# Patient Record
Sex: Female | Born: 1970 | Race: White | Hispanic: No | Marital: Married | Smoking: Never smoker
Health system: Southern US, Community
[De-identification: ages and names within clinical notes are randomized; demographics above are authoritative.]

## PROBLEM LIST (undated history)

## (undated) DIAGNOSIS — R9082 White matter disease, unspecified: Secondary | ICD-10-CM

## (undated) DIAGNOSIS — R202 Paresthesia of skin: Secondary | ICD-10-CM

## (undated) DIAGNOSIS — G9332 Myalgic encephalomyelitis/chronic fatigue syndrome: Secondary | ICD-10-CM

## (undated) DIAGNOSIS — K259 Gastric ulcer, unspecified as acute or chronic, without hemorrhage or perforation: Secondary | ICD-10-CM

## (undated) DIAGNOSIS — G43909 Migraine, unspecified, not intractable, without status migrainosus: Secondary | ICD-10-CM

## (undated) DIAGNOSIS — R011 Cardiac murmur, unspecified: Secondary | ICD-10-CM

## (undated) DIAGNOSIS — E785 Hyperlipidemia, unspecified: Secondary | ICD-10-CM

## (undated) DIAGNOSIS — E039 Hypothyroidism, unspecified: Secondary | ICD-10-CM

## (undated) DIAGNOSIS — F419 Anxiety disorder, unspecified: Secondary | ICD-10-CM

## (undated) DIAGNOSIS — M5030 Other cervical disc degeneration, unspecified cervical region: Secondary | ICD-10-CM

## (undated) DIAGNOSIS — G8929 Other chronic pain: Secondary | ICD-10-CM

## (undated) DIAGNOSIS — R7303 Prediabetes: Secondary | ICD-10-CM

## (undated) DIAGNOSIS — F32A Depression, unspecified: Secondary | ICD-10-CM

## (undated) HISTORY — PX: BREAST CYST EXCISION: SHX579

## (undated) HISTORY — PX: CHOLECYSTECTOMY: SHX55

---

## 1998-08-31 ENCOUNTER — Other Ambulatory Visit: Admission: RE | Admit: 1998-08-31 | Discharge: 1998-08-31 | Payer: Self-pay | Admitting: Obstetrics and Gynecology

## 1998-11-03 DIAGNOSIS — G459 Transient cerebral ischemic attack, unspecified: Secondary | ICD-10-CM

## 1998-11-03 HISTORY — DX: Transient cerebral ischemic attack, unspecified: G45.9

## 1999-03-28 ENCOUNTER — Inpatient Hospital Stay (HOSPITAL_COMMUNITY): Admission: AD | Admit: 1999-03-28 | Discharge: 1999-03-30 | Payer: Self-pay | Admitting: Obstetrics and Gynecology

## 1999-05-21 ENCOUNTER — Other Ambulatory Visit: Admission: RE | Admit: 1999-05-21 | Discharge: 1999-05-21 | Payer: Self-pay | Admitting: Obstetrics and Gynecology

## 2000-09-08 ENCOUNTER — Other Ambulatory Visit: Admission: RE | Admit: 2000-09-08 | Discharge: 2000-09-08 | Payer: Self-pay | Admitting: Obstetrics and Gynecology

## 2002-02-08 ENCOUNTER — Other Ambulatory Visit: Admission: RE | Admit: 2002-02-08 | Discharge: 2002-02-08 | Payer: Self-pay | Admitting: Obstetrics and Gynecology

## 2002-03-15 ENCOUNTER — Encounter: Payer: Self-pay | Admitting: Obstetrics and Gynecology

## 2002-03-15 ENCOUNTER — Encounter: Admission: RE | Admit: 2002-03-15 | Discharge: 2002-03-15 | Payer: Self-pay | Admitting: Obstetrics and Gynecology

## 2002-11-03 HISTORY — PX: AUGMENTATION MAMMAPLASTY: SUR837

## 2003-02-27 ENCOUNTER — Other Ambulatory Visit: Admission: RE | Admit: 2003-02-27 | Discharge: 2003-02-27 | Payer: Self-pay | Admitting: Obstetrics and Gynecology

## 2004-03-07 ENCOUNTER — Other Ambulatory Visit: Admission: RE | Admit: 2004-03-07 | Discharge: 2004-03-07 | Payer: Self-pay | Admitting: Obstetrics and Gynecology

## 2005-06-19 ENCOUNTER — Other Ambulatory Visit: Admission: RE | Admit: 2005-06-19 | Discharge: 2005-06-19 | Payer: Self-pay | Admitting: Obstetrics and Gynecology

## 2012-10-06 ENCOUNTER — Other Ambulatory Visit (HOSPITAL_COMMUNITY): Payer: Self-pay | Admitting: Obstetrics and Gynecology

## 2012-10-06 DIAGNOSIS — E041 Nontoxic single thyroid nodule: Secondary | ICD-10-CM

## 2012-10-07 ENCOUNTER — Ambulatory Visit (HOSPITAL_COMMUNITY): Payer: BC Managed Care – PPO

## 2012-11-09 ENCOUNTER — Ambulatory Visit (HOSPITAL_COMMUNITY)
Admission: RE | Admit: 2012-11-09 | Discharge: 2012-11-09 | Disposition: A | Discharge status: Home / Self Care | Payer: BC Managed Care – PPO | Source: Ambulatory Visit | Attending: Obstetrics and Gynecology | Admitting: Obstetrics and Gynecology

## 2012-11-09 DIAGNOSIS — E041 Nontoxic single thyroid nodule: Secondary | ICD-10-CM

## 2012-11-09 DIAGNOSIS — Z0389 Encounter for observation for other suspected diseases and conditions ruled out: Secondary | ICD-10-CM | POA: Insufficient documentation

## 2013-10-18 ENCOUNTER — Other Ambulatory Visit: Payer: Self-pay | Admitting: Obstetrics and Gynecology

## 2013-10-18 DIAGNOSIS — R928 Other abnormal and inconclusive findings on diagnostic imaging of breast: Secondary | ICD-10-CM

## 2013-11-01 ENCOUNTER — Ambulatory Visit
Admission: RE | Admit: 2013-11-01 | Discharge: 2013-11-01 | Disposition: A | Discharge status: Home / Self Care | Payer: BC Managed Care – PPO | Source: Ambulatory Visit | Attending: Obstetrics and Gynecology | Admitting: Obstetrics and Gynecology

## 2013-11-01 DIAGNOSIS — R928 Other abnormal and inconclusive findings on diagnostic imaging of breast: Secondary | ICD-10-CM

## 2014-11-03 HISTORY — PX: CARPAL TUNNEL RELEASE: SHX101

## 2018-01-13 ENCOUNTER — Other Ambulatory Visit: Payer: Self-pay | Admitting: Obstetrics and Gynecology

## 2018-01-13 DIAGNOSIS — N6452 Nipple discharge: Secondary | ICD-10-CM

## 2018-01-18 ENCOUNTER — Ambulatory Visit
Admission: RE | Admit: 2018-01-18 | Discharge: 2018-01-18 | Disposition: A | Discharge status: Home / Self Care | Payer: BLUE CROSS/BLUE SHIELD | Source: Ambulatory Visit | Attending: Obstetrics and Gynecology | Admitting: Obstetrics and Gynecology

## 2018-01-18 ENCOUNTER — Ambulatory Visit: Payer: Self-pay

## 2018-01-18 DIAGNOSIS — N6452 Nipple discharge: Secondary | ICD-10-CM

## 2019-01-20 ENCOUNTER — Other Ambulatory Visit: Payer: Self-pay | Admitting: Obstetrics and Gynecology

## 2019-01-20 DIAGNOSIS — R928 Other abnormal and inconclusive findings on diagnostic imaging of breast: Secondary | ICD-10-CM

## 2019-01-26 ENCOUNTER — Other Ambulatory Visit: Payer: BLUE CROSS/BLUE SHIELD

## 2019-09-12 IMAGING — MG DIGITAL DIAGNOSTIC BILATERAL MAMMOGRAM WITH IMPLANTS, CAD AND TO
9 of 16 series · 9 of 40 positions shown · non-contrast
Comparison: Previous exam(s).

CLINICAL DATA: Bilateral spontaneous nipple discharge for over a
year. This is manifested by wetness on the clothing and clear to
white colored crustiness.

EXAM:
2D DIGITAL DIAGNOSTIC BILATERAL MAMMOGRAM WITH IMPLANTS, CAD AND
ADJUNCT TOMO
The patient has retropectoral implants. Standard and implant
displaced views were performed.

[L CC]
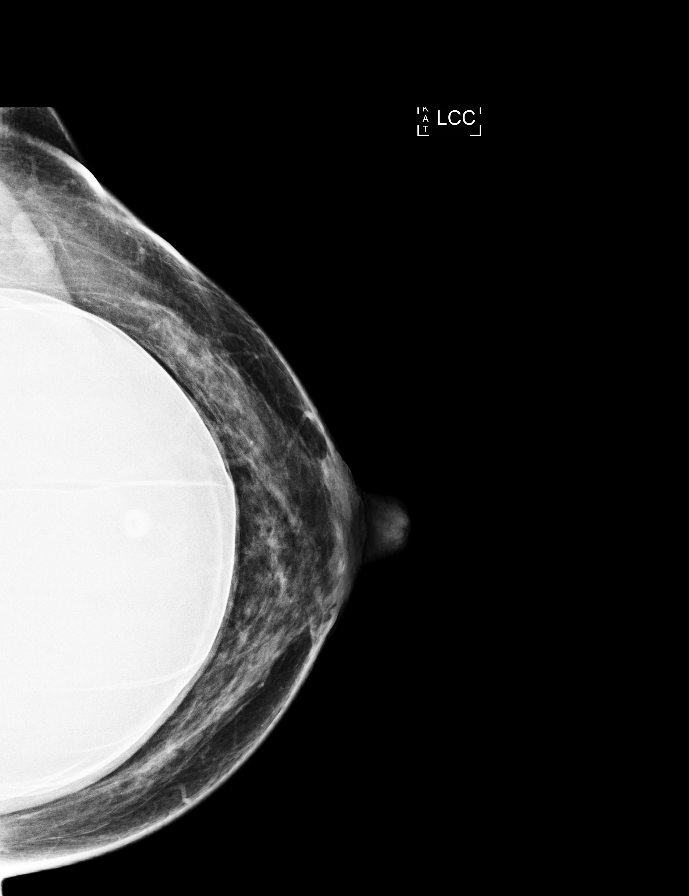

[R MLO]
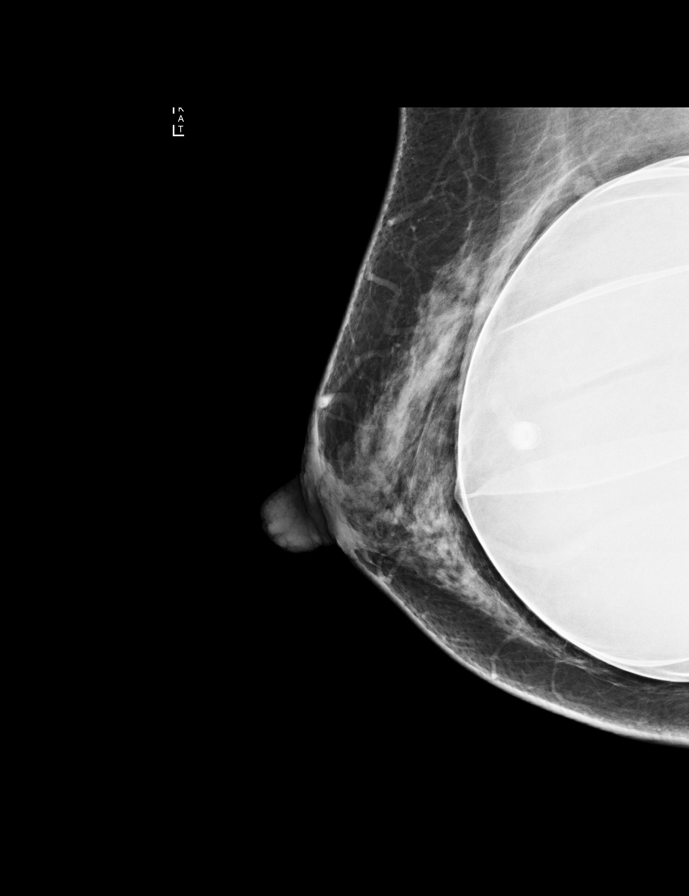

[R CC]
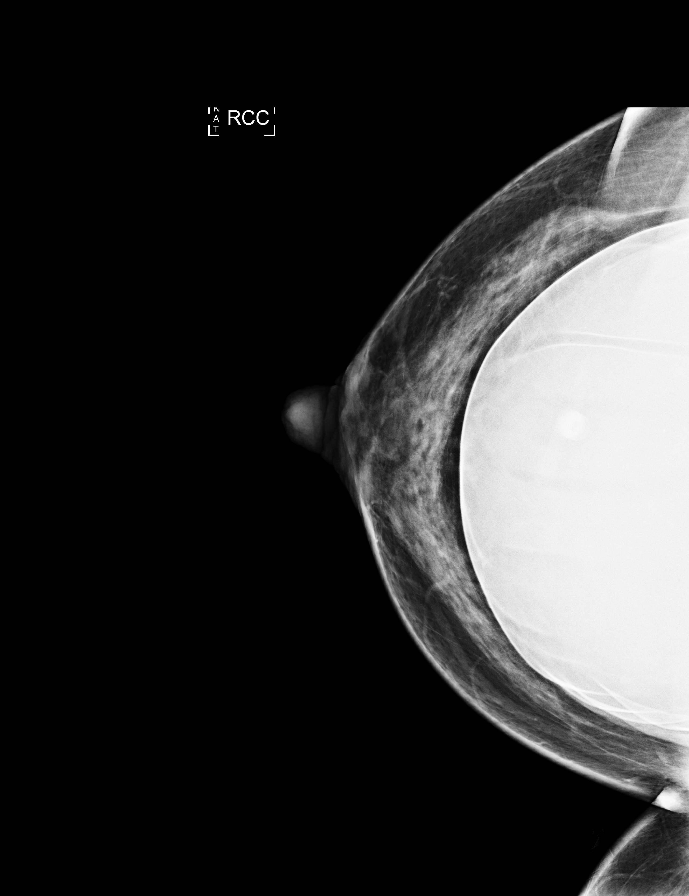

[L MLO]
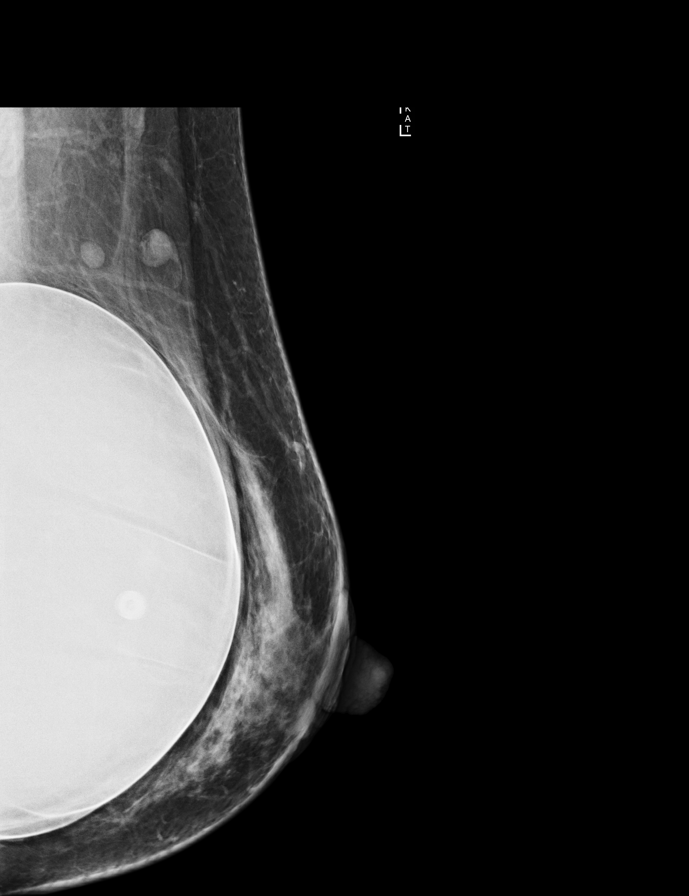

[L MLO synth-2D (1 of 2)]
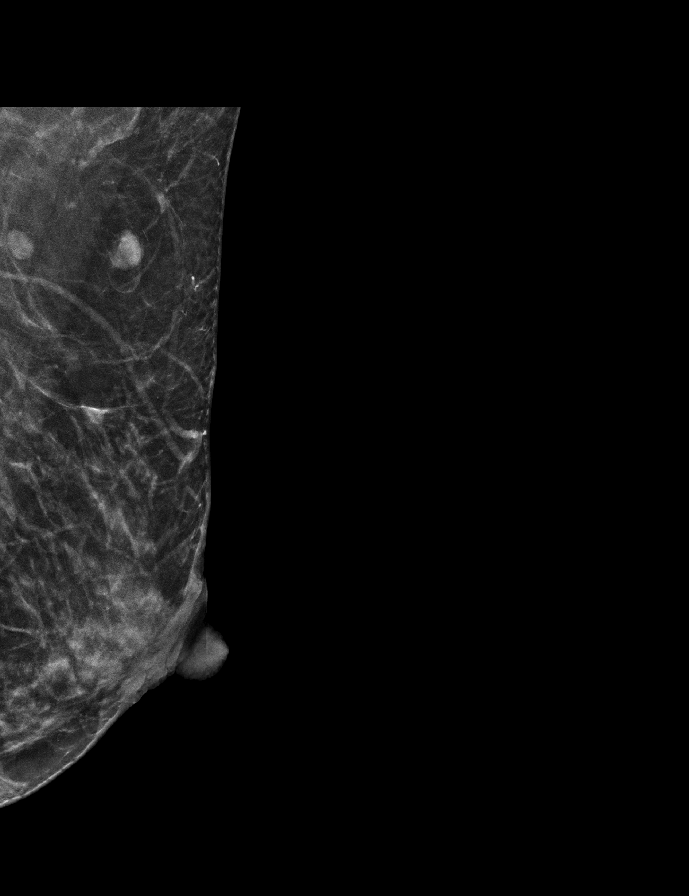

[R MLO synth-2D]
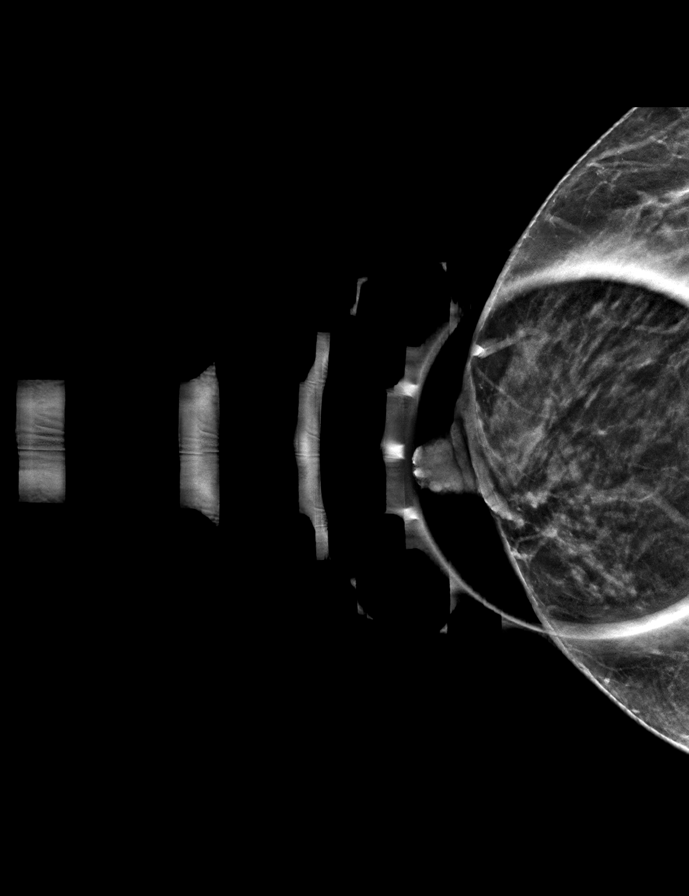

[L MLO synth-2D (2 of 2)]
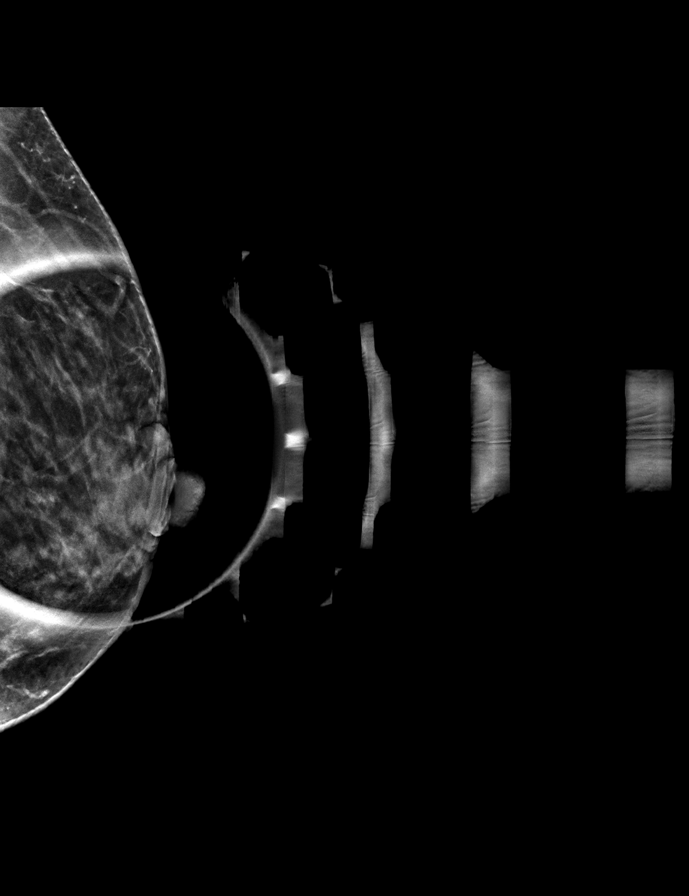

[L CC synth-2D]
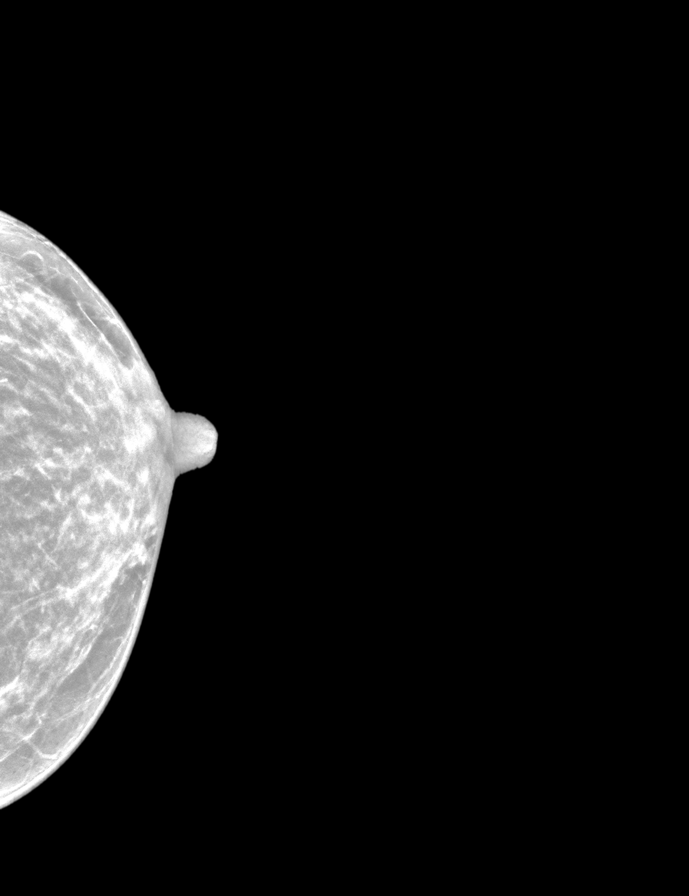

[R CC synth-2D]
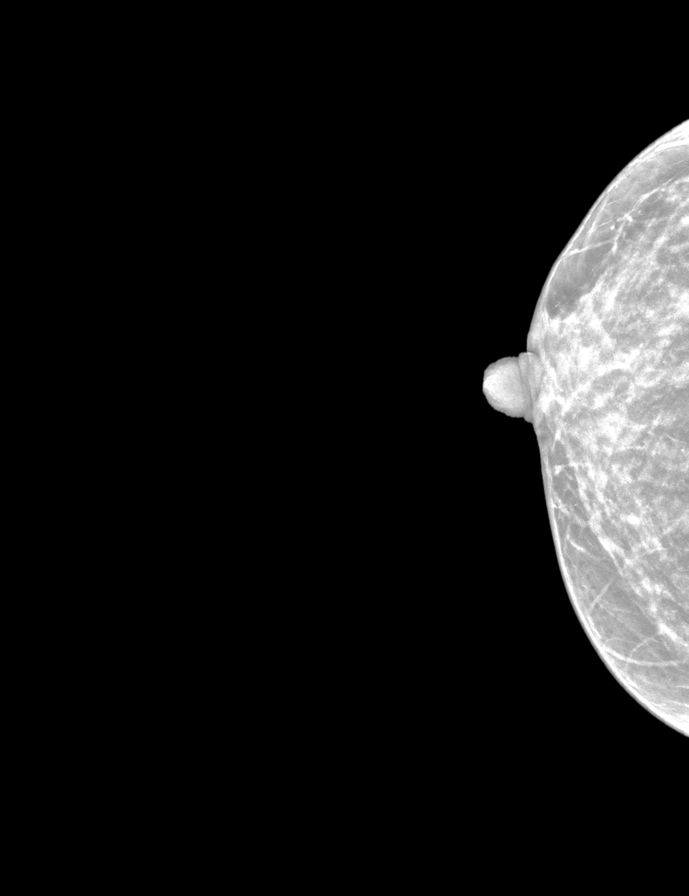

[9 of 40 positions shown; findings below may reference images not displayed]

ACR Breast Density Category c: The breast tissue is heterogeneously
dense, which may obscure small masses.
FINDINGS: Stable mammographic appearance of the breasts with no findings
suspicious for malignancy or implant rupture.

Mammographic images were processed with CAD.
IMPRESSION: No evidence of malignancy. The bilateral spontaneous nipple
discharge is compatible with benign physiological discharge.

RECOMMENDATION:
Bilateral screening mammogram in 1 year.

I have discussed the findings and recommendations with the patient.
Results were also provided in writing at the conclusion of the
visit. If applicable, a reminder letter will be sent to the patient
regarding the next appointment.

BI-RADS CATEGORY  1: Negative.

## 2020-11-12 NOTE — Progress Notes (Signed)
New Patient Note  RE: Debra Spears MRN: [id number] DOB: 01/22/1971 Date of Office Visit: 11/13/2020  Referring provider: No ref. provider found Primary care provider: Marlyn Corporal, PA  Chief Complaint: Debra Spears Challenge (Covid component testing)  History of Present Illness: I had the pleasure of seeing Debra Spears for initial evaluation at the [facility] on 11/13/2020. She is a 50 y.o. female, who is referred here by Marlyn Corporal, PA for the evaluation of vaccine reaction to [facility].  On 11/08/2019, patient received her first Moderna vaccine and she remembers having some type of rash on the opposite arm of her injection about 1 day afterwards and resolved fairly quickly. She didn't think much of this as she didn't take any medications and didn't even think it was relate to the vaccine.  On 12/06/2019, patient received her second Moderna vaccine and about 1-2 days afterwards patient developed a rash on her cheek and it spread to her face and chest. She also felt like she couldn't breathe that well. The following day she had PCP visit and was told that she had an "allergic" reaction.   She took benadryl for this and other antihistamines. The symptoms resolved after a few days.  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 No prior COVID-19 infection.  Any known reactions to polyethylene glycol or polysorbate?  No.  Any history of anaphylaxis to vaccinations?  Localized arm swelling from the smallpox vaccine at age 66.  No reactions to other vaccines.   Any history of reactions to injectable medications? No.  Any history of anaphylaxis to colonoscopy preps (i.e.Miralax)? No prior colonoscopy or Miralax exposure.   Any history of dermal filler treatments in the last year? No.  Reviewed images on the phone - erythematous patch on bilateral cheeks area. Difficult to tell if they are raised or flat.   Assessment and Plan: Debra Spears  is a 50 y.o. female with: Adverse reaction to viral vaccines Developed a small rash on the opposite arm of injection one day after her first Moderna vaccine which resolved without any medications. Broke out in a facial/chest rash 1-2 days after her second Moderna vaccine. Some difficulty breathing possibly. Symptoms resolved with antihistamines. Concerned about getting the booster vaccine.   Today's skin prick and intradermal testing was negative to Miralax (source of PEG 3350), methylprednisolone acetate (source of PEG 3350), and triamcinolone acetonide (source of polysorbate-80).  She tolerated oral Miralax without any issues.   Based on clinical history, she most likely had a strong immune response to the vaccine rather than an IgE-mediated allergic reaction given the timeline of events and above test results.   Discussed the risks and benefits of getting a booster.   I would recommend getting either Pfizer or Moderna as a booster. J&J seems to be the less effective.    Take zyrtec 10mg  1-2 hours before the vaccine.  Wait 30 minutes after the injection.  Have someone drive you to the vaccination appointment.   We do give vaccines in our office as well if interested.   Other allergic rhinitis Rhinoconjunctivitis symptoms for 20 years.  Using Flonase as needed with good benefit.  May use over the counter antihistamines such as Zyrtec (cetirizine), Claritin (loratadine), Allegra (fexofenadine), or Xyzal (levocetirizine) daily as needed.  May use Flonase (fluticasone) nasal spray 1 spray per nostril twice a day as needed for nasal congestion.   Consider environmental allergy skin testing in future.  Return if symptoms worsen or fail to improve.  Other allergy screening: Asthma: no Rhino conjunctivitis: yes  Coughing, rhinorrhea, watery eyes for 20 years. Using Flonase prn.   Food allergy: no Medication allergy: no Hymenoptera allergy: no  Localized reaction.  Urticaria:  no Eczema:no History of recurrent infections suggestive of immunodeficency: no  Diagnostics: Skin Testing: COVID-19 component testing. Today's skin prick and intradermal testing was negative to Miralax (source of PEG 3350), methylprednisolone acetate (source of PEG 3350), and triamcinolone acetonide (source of polysorbate-80). Results discussed with patient/family.  COVID Vaccine Testing - 11/13/20 0912      Test Information   Medications Miralax    Triamcinolone Lot # [id number]    Triamcinolone EXP DATE 09/03/21    Methylprednisolone Lot # [id number]    Methylprednisolone EXP DATE 01/01/21    Miralax Lot # [id number]    Miralax EXP DATE 05/03/22      SKIN PRICK TESTING - Arm #1   Location Right Arm    Select Select      HISTAMINE (1mg /mL) Skin Prick Arm #1   Histamine Time Testing Placed 0935    Histamine Wheal 2+      Control (negative - HSA) Skin Prick Arm #1   Control Time Testing Placed 0935    Control Wheal Negative      Triamcinolone (40mg /mL) Skin Prick Arm #1   Triamcinolone Time Testing Placed 0935    Triamcinolone Wheal Negative      Methylprednisolone (40mg /mL) Skin Prick Arm #1   Methylprednisolone Time Testing Placed 0935    Methylprednisolone Wheal Negative      Miralax (1:100 or 1.7 mg/mL) Skin Prick Arm #1   Miralax Time Testing Placed 0935    Miralax Wheal Negative      Miralax (1:10 or 17mg /mL) Skin Prick Arm #1   Miralax Time Testing Placed 1000    Miralax Wheal Negative      Miralax (1:1 or 170mg /mL) Skin Prick Arm #1   Miralax Time Testing Placed 1025    Miralax Wheal Negative      INTRADERMAL TESTING - Arm #2   Location Left Arm      Skin Prick/Intradermal Post Testing   Skin Prick/Intradermal Testing Total Pricks 13      ORAL CHALLENGE TESTING   Select Select      Pre Challenge Vitals   BP 118/78    Pulse 68    Resp 18      Miralax 170mg /mL Suspension Oral Challenge 0.3 mL   Miralax 0.3 mL Time Given 1100      Miralax 170mg /mL  Suspension Oral Challenge 3 mL   Miralax 3 mL Time Given 1118      Miralax 170mg /mL Suspension Oral Challenge 15 mL   Miralax 15 mL Time Given 1141      Post Test Vitals   BP 116/76    Pulse 60    Resp 20           Past Medical History: Patient Active Problem List   Diagnosis Date Noted   Adverse reaction to viral vaccines 11/13/2020   Other allergic rhinitis 11/13/2020   History reviewed. No pertinent past medical history. Past Surgical History: Past Surgical History:  Procedure Laterality Date   AUGMENTATION MAMMAPLASTY Bilateral 2004   BREAST CYST EXCISION Left    Medication List:  Current Outpatient Medications  Medication Sig Dispense Refill   ALPRAZolam (XANAX) 0.5 MG tablet Take 0.5 mg by mouth daily as needed.     cyclobenzaprine (  FLEXERIL) 10 MG tablet Take 10 mg by mouth 3 (three) times daily.     ergocalciferol (VITAMIN D2) 1.25 MG (50000 UT) capsule Vitamin D2 1,250 mcg (50,000 unit) capsule     fluticasone (FLONASE) 50 MCG/ACT nasal spray fluticasone propionate 50 mcg/actuation nasal spray,suspension  USE 2 SPRAYS IN EACH NOSTRIL DAILY     gabapentin (NEURONTIN) 300 MG capsule Take 300 mg by mouth 3 (three) times daily.     levothyroxine (SYNTHROID) 100 MCG tablet Take by mouth.     meloxicam (MOBIC) 15 MG tablet Take 15 mg by mouth daily.     oxyCODONE (OXY IR/ROXICODONE) 5 MG immediate release tablet Take 5 mg by mouth every 4 (four) hours as needed.     SYNTHROID 88 MCG tablet Take 88 mcg by mouth daily.     venlafaxine XR (EFFEXOR-XR) 37.5 MG 24 hr capsule Take 37.5 mg by mouth daily.     No current facility-administered medications for this visit.   Allergies: Allergies  Allergen Reactions   Hydrocodone Other (See Comments)   Covid-19 (Adenovirus) Vaccine Rash    Patient states it was not the J&J vaccine. It was moderna.    Social History: Social History   Socioeconomic History   Marital status: Married    Spouse name: Not  on file   Number of children: Not on file   Years of education: Not on file   Highest education level: Not on file  Occupational History   Not on file  Tobacco Use   Smoking status: Never Smoker   Smokeless tobacco: Never Used  Vaping Use   Vaping Use: Never used  Substance and Sexual Activity   Alcohol use: Yes   Drug use: Never   Sexual activity: Not on file  Other Topics Concern   Not on file  Social History Narrative   Not on file   Social Determinants of Health   Financial Resource Strain: Not on file  Food Insecurity: Not on file  Transportation Needs: Not on file  Physical Activity: Not on file  Stress: Not on file  Social Connections: Not on file   Lives in a house [place] in [place]. Smoking: denies Occupation: Air traffic controllerproject coordinator  Environmental HistorySurveyor, minerals: Water Damage/mildew in the house: no Engineer, civil (consulting)Carpet in the family room: no Carpet in the bedroom: no Heating: gas Cooling: central Pet: yes; 1 cat x <1 yr and 2 dogs x 16 yrs, 10 yrs  Family History: History reviewed. No pertinent family history. Problem                               Relation Asthma                                   No  Eczema                                No  Food allergy                          No  Allergic rhino conjunctivitis     Daughters  Drug allergies   Daughters    Review of Systems  Constitutional: Negative for appetite change, chills, fever and unexpected weight change.  HENT: Negative for congestion and rhinorrhea.   Eyes:  Negative for itching.  Respiratory: Negative for cough, chest tightness, shortness of breath and wheezing.   Cardiovascular: Negative for chest pain.  Gastrointestinal: Negative for abdominal pain.  Genitourinary: Negative for difficulty urinating.  Skin: Negative for rash.  Neurological: Negative for headaches.   Objective: BP 118/78 (BP Location: Right Arm, Patient Position: Sitting, Cuff Size: Normal)    Pulse 68    Temp (!) 96.3 F (35.7  C) (Temporal)    Resp 18    Ht 5' 6.54" (1.69 m)    Wt 157 lb (71.2 kg)    SpO2 98%    BMI 24.93 kg/m  Body mass index is 24.93 kg/m. Physical Exam Vitals and nursing note reviewed.  Constitutional:      Appearance: Normal appearance. She is well-developed.  HENT:     Head: Normocephalic and atraumatic.     Right Ear: External ear normal.     Left Ear: External ear normal.     Nose: Nose normal.     Mouth/Throat:     Mouth: Mucous membranes are moist.     Pharynx: Oropharynx is clear.  Eyes:     Conjunctiva/sclera: Conjunctivae normal.  Cardiovascular:     Rate and Rhythm: Normal rate and regular rhythm.     Heart sounds: Normal heart sounds. No murmur heard. No friction rub. No gallop.   Pulmonary:     Effort: Pulmonary effort is normal.     Breath sounds: Normal breath sounds. No wheezing, rhonchi or rales.  Abdominal:     Palpations: Abdomen is soft.  Musculoskeletal:     Cervical back: Neck supple.  Skin:    General: Skin is warm.     Findings: No rash.  Neurological:     Mental Status: She is alert and oriented to person, place, and time.  Psychiatric:        Behavior: Behavior normal.    The plan was reviewed with the patient/family, and all questions/concerned were addressed.  It was my pleasure to see Debra Spears today and participate in her care. Please feel free to contact me with any questions or concerns.  Sincerely,  Wyline Mood, DO Allergy & Immunology  [facility] office: [phone] [facility] office: [phone]

## 2020-11-13 ENCOUNTER — Encounter: Payer: Self-pay | Admitting: Allergy

## 2020-11-13 ENCOUNTER — Other Ambulatory Visit: Payer: Self-pay

## 2020-11-13 ENCOUNTER — Ambulatory Visit (INDEPENDENT_AMBULATORY_CARE_PROVIDER_SITE_OTHER): Payer: Commercial Managed Care - PPO | Admitting: Allergy

## 2020-11-13 VITALS — BP 118/78 | HR 68 | Temp 96.3°F | Resp 18 | Ht 66.54 in | Wt 157.0 lb

## 2020-11-13 DIAGNOSIS — J3089 Other allergic rhinitis: Secondary | ICD-10-CM

## 2020-11-13 DIAGNOSIS — R21 Rash and other nonspecific skin eruption: Secondary | ICD-10-CM

## 2020-11-13 DIAGNOSIS — T50B95A Adverse effect of other viral vaccines, initial encounter: Secondary | ICD-10-CM | POA: Insufficient documentation

## 2020-11-13 DIAGNOSIS — T50B95D Adverse effect of other viral vaccines, subsequent encounter: Secondary | ICD-10-CM | POA: Diagnosis not present

## 2020-11-13 DIAGNOSIS — R0602 Shortness of breath: Secondary | ICD-10-CM | POA: Diagnosis not present

## 2020-11-13 NOTE — Assessment & Plan Note (Addendum)
Developed a small rash on the opposite arm of injection one day after her first Moderna vaccine which resolved without any medications. Broke out in a facial/chest rash 1-2 days after her second Moderna vaccine. Some difficulty breathing possibly. Symptoms resolved with antihistamines. Concerned about getting the booster vaccine.   Today's skin prick and intradermal testing was negative to Miralax (source of PEG 3350), methylprednisolone acetate (source of PEG 3350), and triamcinolone acetonide (source of polysorbate-80).  She tolerated oral Miralax without any issues.   Based on clinical history, she most likely had a strong immune response to the vaccine rather than an IgE-mediated allergic reaction given the timeline of events and above test results.   Discussed the risks and benefits of getting a booster.   I would recommend getting either Pfizer or Moderna as a booster. J&J seems to be the less effective.    Take zyrtec 10mg  1-2 hours before the vaccine.  Wait 30 minutes after the injection.  Have someone drive you to the vaccination appointment.   We do give vaccines in our office as well if interested.

## 2020-11-13 NOTE — Patient Instructions (Addendum)
   Today's skin prick and intradermal testing was negative to Miralax (source of PEG 3350), methylprednisolone acetate (source of PEG 3350), and triamcinolone acetonide (source of polysorbate-80).  Based on your history, you most likely had a strong immune response rather than an IgE-mediated allergic reaction given the timeline of events.    Recommendation regarding booster:  Pfizer or Moderna   Take zyrtec 10mg  1-2 hours before the vaccine.  Wait 30 minutes after the injection.  Have someone drive you to the vaccination appointment.   We do give vaccines in our office as well   Next vaccine date for [facility] is on 1/20.  Environmental allergies  You are more than welcome to come back for skin testing for this.  May use over the counter antihistamines such as Zyrtec (cetirizine), Claritin (loratadine), Allegra (fexofenadine), or Xyzal (levocetirizine) daily as needed.  May use Flonase (fluticasone) nasal spray 1 spray per nostril twice a day as needed for nasal congestion.   Follow up as needed.

## 2020-11-13 NOTE — Assessment & Plan Note (Signed)
Rhinoconjunctivitis symptoms for 20 years.  Using Flonase as needed with good benefit.  May use over the counter antihistamines such as Zyrtec (cetirizine), Claritin (loratadine), Allegra (fexofenadine), or Xyzal (levocetirizine) daily as needed.  May use Flonase (fluticasone) nasal spray 1 spray per nostril twice a day as needed for nasal congestion.   Consider environmental allergy skin testing in future.

## 2021-04-02 DIAGNOSIS — R0789 Other chest pain: Secondary | ICD-10-CM

## 2021-04-02 HISTORY — DX: Other chest pain: R07.89

## 2021-04-18 ENCOUNTER — Other Ambulatory Visit: Payer: Self-pay | Admitting: Obstetrics and Gynecology

## 2021-04-18 DIAGNOSIS — R928 Other abnormal and inconclusive findings on diagnostic imaging of breast: Secondary | ICD-10-CM

## 2021-04-19 DIAGNOSIS — R413 Other amnesia: Secondary | ICD-10-CM

## 2021-04-19 HISTORY — DX: Other amnesia: R41.3

## 2021-08-13 ENCOUNTER — Ambulatory Visit: Payer: Commercial Managed Care - PPO | Admitting: Sports Medicine

## 2021-11-03 DIAGNOSIS — N920 Excessive and frequent menstruation with regular cycle: Secondary | ICD-10-CM

## 2021-11-03 HISTORY — DX: Excessive and frequent menstruation with regular cycle: N92.0

## 2022-04-02 ENCOUNTER — Encounter (HOSPITAL_BASED_OUTPATIENT_CLINIC_OR_DEPARTMENT_OTHER): Payer: Self-pay | Admitting: Obstetrics and Gynecology

## 2022-04-04 ENCOUNTER — Other Ambulatory Visit: Payer: Self-pay

## 2022-04-04 ENCOUNTER — Encounter (HOSPITAL_BASED_OUTPATIENT_CLINIC_OR_DEPARTMENT_OTHER): Payer: Self-pay | Admitting: Obstetrics and Gynecology

## 2022-04-04 NOTE — Progress Notes (Signed)
Your procedure is scheduled on Monday, 04/14/22.  Report to [facility] AT  5:30 A. M.   Call this number if you have problems the morning of surgery  :[phone].   OUR ADDRESS IS [address].  WE ARE LOCATED IN [facility].  PLEASE BRING YOUR INSURANCE CARD AND PHOTO ID DAY OF SURGERY.  ONLY 2 PEOPLE ARE ALLOWED IN  WAITING  ROOM.                                      REMEMBER:  DO NOT EAT FOOD, CANDY GUM OR MINTS  AFTER MIDNIGHT THE NIGHT BEFORE YOUR SURGERY . YOU MAY HAVE CLEAR LIQUIDS FROM MIDNIGHT THE NIGHT BEFORE YOUR SURGERY UNTIL  4:30 AM. NO CLEAR LIQUIDS AFTER   4:30 AM DAY OF SURGERY.  YOU MAY  BRUSH YOUR TEETH MORNING OF SURGERY AND RINSE YOUR MOUTH OUT, NO CHEWING GUM CANDY OR MINTS.     CLEAR LIQUID DIET   Foods Allowed                                                                     Foods Excluded  Coffee and tea, regular and decaf                             liquids that you cannot  Plain Jell-O                                                                   see through such as: Fruit ices (not with fruit pulp)                                     milk, soups, orange juice  Plain  Popsicles                                    All solid food Carbonated beverages, regular and diet                                    Cranberry, grape and apple juices Sports drinks like Gatorade _____________________________________________________________________     TAKE THESE MEDICATIONS MORNING OF SURGERY: Synthroid, Flonase if needed Please do not take any vitamins or supplements the morning of surgery.    UP TO 4 VISITORS  MAY VISIT IN THE EXTENDED RECOVERY ROOM UNTIL 800 PM ONLY.  1 VISITOR AGE 33 AND OVER MAY SPEND THE NIGHT AND MUST BE IN EXTENDED RECOVERY ROOM NO LATER THAN 800 PM . YOUR DISCHARGE TIME AFTER YOU SPEND THE NIGHT IS 900 AM THE MORNING AFTER YOUR SURGERY.  YOU MAY PACK A SMALL OVERNIGHT BAG WITH TOILETRIES  FOR YOUR OVERNIGHT STAY IF YOU WISH.  YOUR PRESCRIPTION MEDICATIONS WILL BE PROVIDED DURING [place].                                      DO NOT WEAR JEWERLY, MAKE UP. DO NOT WEAR LOTIONS, POWDERS, PERFUMES OR NAIL POLISH ON YOUR FINGERNAILS. TOENAIL POLISH IS OK TO WEAR. DO NOT SHAVE FOR 48 HOURS PRIOR TO DAY OF SURGERY. MEN MAY SHAVE FACE AND NECK. CONTACTS, GLASSES, OR DENTURES MAY NOT BE WORN TO SURGERY.  REMEMBER: NO SMOKING, DRUGS OR ALCOHOL FOR 24 HOURS BEFORE YOUR SURGERY.                                    [facility] IS NOT RESPONSIBLE  FOR ANY BELONGINGS.                                                                    Marland Kitchen            - Preparing for Surgery Before surgery, you can play an important role.  Because skin is not sterile, your skin needs to be as free of germs as possible.  You can reduce the number of germs on your skin by washing with CHG (chlorahexidine gluconate) soap before surgery.  CHG is an antiseptic cleaner which kills germs and bonds with the skin to continue killing germs even after washing. Please DO NOT use if you have an allergy to CHG or antibacterial soaps.  If your skin becomes reddened/irritated stop using the CHG and inform your nurse when you arrive at Short Stay. Do not shave (including legs and underarms) for at least 48 hours prior to the first CHG shower.  You may shave your face/neck. Please follow these instructions carefully:  1.  Shower with CHG Soap the night before surgery and the  morning of Surgery.  2.  If you choose to wash your hair, wash your hair first as usual with your  normal  shampoo.  3.  After you shampoo, rinse your hair and body thoroughly to remove the  shampoo.                            4.  Use CHG as you would any other liquid soap.  You can apply chg directly  to the skin and wash , please wash your belly button thoroughly with chg soap provided night before and morning of your surgery.                      Gently with a scrungie or clean washcloth.  5.  Apply the CHG Soap to your body ONLY FROM THE NECK DOWN.   Do not use on face/ open                           Wound or open sores. Avoid contact with eyes, ears mouth and genitals (private  parts).                       Wash face,  Genitals (private parts) with your normal soap.             6.  Wash thoroughly, paying special attention to the area where your surgery  will be performed.  7.  Thoroughly rinse your body with warm water from the neck down.  8.  DO NOT shower/wash with your normal soap after using and rinsing off  the CHG Soap.                9.  Pat yourself dry with a clean towel.            10.  Wear clean pajamas.            11.  Place clean sheets on your bed the night of your first shower and do not  sleep with pets. Day of Surgery : Do not apply any lotions/deodorants the morning of surgery.  Please wear clean clothes to the hospital/surgery center.  IF YOU HAVE ANY SKIN IRRITATION OR PROBLEMS WITH THE SURGICAL SOAP, PLEASE GET A BAR OF GOLD DIAL SOAP AND SHOWER THE NIGHT BEFORE YOUR SURGERY AND THE MORNING OF YOUR SURGERY. PLEASE LET THE NURSE KNOW MORNING OF YOUR SURGERY IF YOU HAD ANY PROBLEMS WITH THE SURGICAL SOAP.   ________________________________________________________________________                                                        QUESTIONS [facility] Falling PRE OP NURSE PHONE [phone].

## 2022-04-04 NOTE — Progress Notes (Signed)
Spoke w/ via phone for pre-op interview---[facility] Lab needs dos----urine pregnancy per anesthesia, surgeon orders pending as of 04/04/2022               Lab results------04/10/22 CBC, type & screem, 04/16/21 NM Myocardial Perfusion in Care Everywhere, 01/21/22 MRI of brain COVID test -----patient states asymptomatic no test needed Arrive at -------0530 on Monday, 04/14/22 NPO after MN NO Solid Food.  Clear liquids from MN until---0430 Med rec completed Medications to take morning of surgery -----Flonase prn, Synthroid Diabetic medication -----n/a Patient instructed no nail polish to be worn day of surgery Patient instructed to bring photo id and insurance card day of surgery Patient aware to have Driver (ride ) / caregiver    for 24 hours after surgery - husband, Thayer Ohm Patient Special Instructions -----Contacts/glasses must be removed prior to the OR. Extended stay / Overnight instructions given. Pre-Op special Istructions -----Requested orders from Dr. Arelia Sneddon on 04/01/22 via Epic IB Patient verbalized understanding of instructions that were given at this phone interview. Patient denies shortness of breath, chest pain, fever, cough at this phone interview.
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
## 2022-04-10 ENCOUNTER — Inpatient Hospital Stay (HOSPITAL_COMMUNITY): Admission: RE | Admit: 2022-04-10 | Payer: BLUE CROSS/BLUE SHIELD | Source: Ambulatory Visit

## 2022-04-14 ENCOUNTER — Ambulatory Visit (HOSPITAL_BASED_OUTPATIENT_CLINIC_OR_DEPARTMENT_OTHER)
Admission: RE | Admit: 2022-04-14 | Payer: Commercial Managed Care - PPO | Source: Home / Self Care | Admitting: Obstetrics and Gynecology

## 2022-04-14 DIAGNOSIS — Z01818 Encounter for other preprocedural examination: Secondary | ICD-10-CM

## 2022-04-14 HISTORY — DX: Cardiac murmur, unspecified: R01.1

## 2022-04-14 HISTORY — DX: Paresthesia of skin: R20.2

## 2022-04-14 HISTORY — DX: Hyperlipidemia, unspecified: E78.5

## 2022-04-14 HISTORY — DX: Other chronic pain: G89.29

## 2022-04-14 HISTORY — DX: Hypothyroidism, unspecified: E03.9

## 2022-04-14 HISTORY — DX: Myalgic encephalomyelitis/chronic fatigue syndrome: G93.32

## 2022-04-14 HISTORY — DX: Gastric ulcer, unspecified as acute or chronic, without hemorrhage or perforation: K25.9

## 2022-04-14 HISTORY — DX: Other cervical disc degeneration, unspecified cervical region: M50.30

## 2022-04-14 HISTORY — DX: Depression, unspecified: F32.A

## 2022-04-14 HISTORY — DX: White matter disease, unspecified: R90.82

## 2022-04-14 HISTORY — DX: Anxiety disorder, unspecified: F41.9

## 2022-04-14 HISTORY — DX: Migraine, unspecified, not intractable, without status migrainosus: G43.909

## 2022-04-14 HISTORY — DX: Prediabetes: R73.03

## 2022-04-14 SURGERY — HYSTERECTOMY, VAGINAL, LAPAROSCOPY-ASSISTED, WITH SALPINGO-OOPHORECTOMY
Anesthesia: Choice | Laterality: Bilateral
